# Patient Record
Sex: Male | Born: 1970 | Race: Black or African American | Hispanic: No | Marital: Single | Smoking: Never smoker
Health system: Southern US, Community
[De-identification: ages and names within clinical notes are randomized; demographics above are authoritative.]

## PROBLEM LIST (undated history)

## (undated) HISTORY — PX: HIP SURGERY: SHX245

## (undated) HISTORY — PX: ABDOMINAL SURGERY: SHX537

---

## 2007-08-02 ENCOUNTER — Emergency Department (HOSPITAL_COMMUNITY): Admission: EM | Admit: 2007-08-02 | Discharge: 2007-08-02 | Payer: Self-pay | Admitting: Emergency Medicine

## 2008-11-25 ENCOUNTER — Inpatient Hospital Stay (HOSPITAL_COMMUNITY): Admission: EM | Admit: 2008-11-25 | Discharge: 2008-11-26 | Payer: Self-pay | Admitting: Emergency Medicine

## 2008-11-25 ENCOUNTER — Ambulatory Visit: Payer: Self-pay | Admitting: *Deleted

## 2008-11-25 ENCOUNTER — Encounter (INDEPENDENT_AMBULATORY_CARE_PROVIDER_SITE_OTHER): Payer: Self-pay | Admitting: *Deleted

## 2008-11-26 ENCOUNTER — Encounter (INDEPENDENT_AMBULATORY_CARE_PROVIDER_SITE_OTHER): Payer: Self-pay | Admitting: *Deleted

## 2008-11-26 DIAGNOSIS — R0789 Other chest pain: Secondary | ICD-10-CM

## 2010-05-18 IMAGING — CT CT ANGIO CHEST
2 of 6 series · 19 of 46 positions shown · IV contrast (agent unspecified)
Comparison: Chest x-ray dated 11/25/2008

CLINICAL DATA: Substernal chest pain for 3 days.  Bilateral arm
numbness.

CT ANGIOGRAPHY CHEST WITH CONTRAST
TECHNIQUE: Multidetector CT imaging of the chest was performed
using the standard protocol during bolus administration of
intravenous contrast. Multiplanar CT image reconstructions
including MIPs were obtained to evaluate the vascular anatomy.
Contrast: 100 ml Tmnipaque-KAA

[Series 6: dissection 2.0 st · axial · 0.70mm/px · z∈[-299,-21]mm · 13 of 163 slices shown]
[im 12/163  lung]
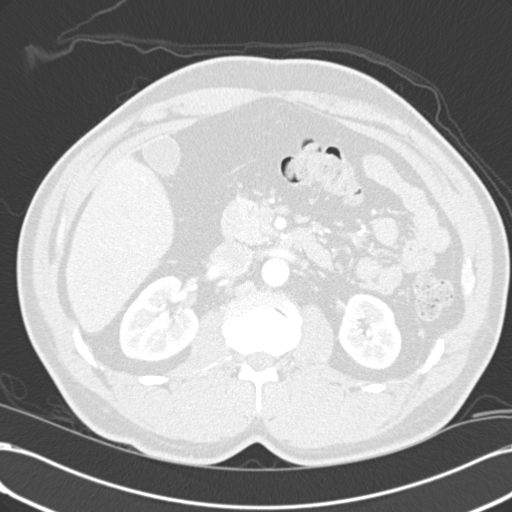
[im 24/163  soft-tissue]
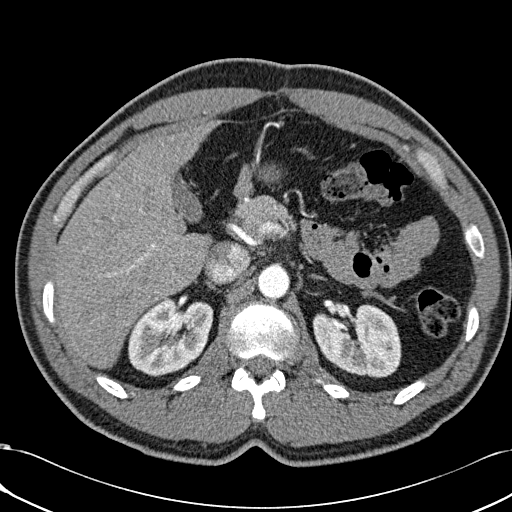
[im 35/163  lung]
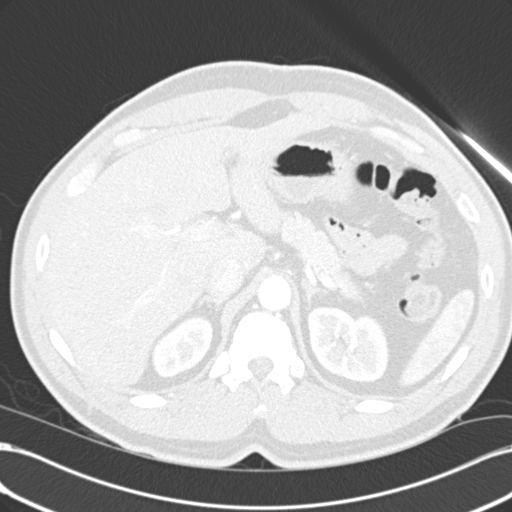
[im 47/163  soft-tissue]
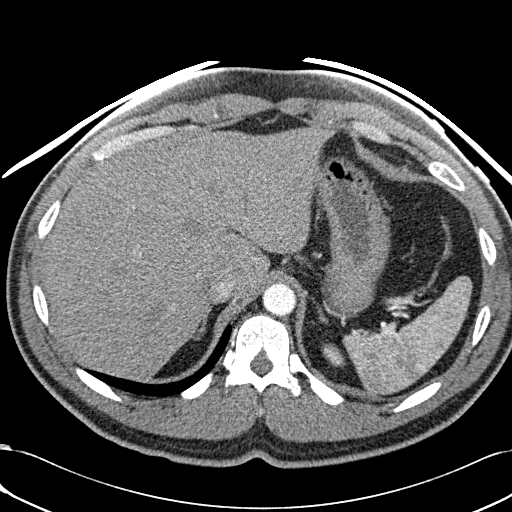
[im 58/163  lung]
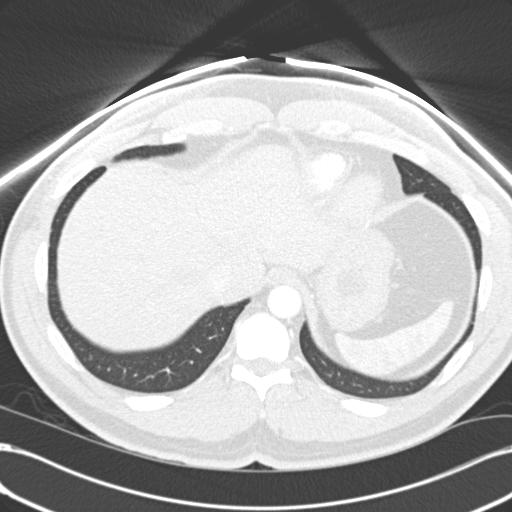
[im 70/163  soft-tissue]
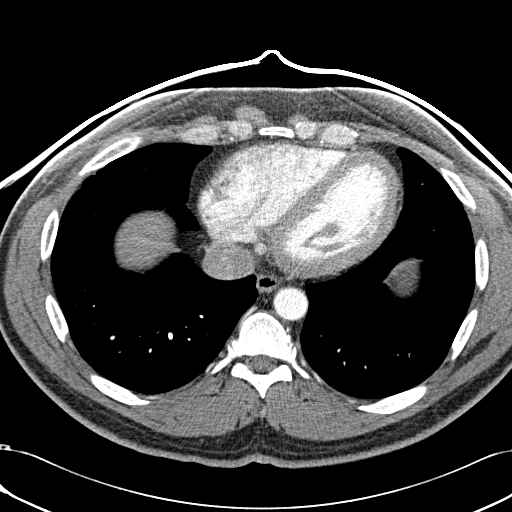
[im 82/163  lung]
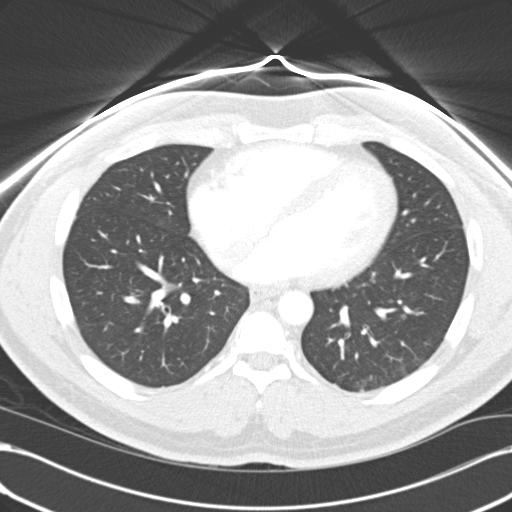
[im 93/163  soft-tissue]
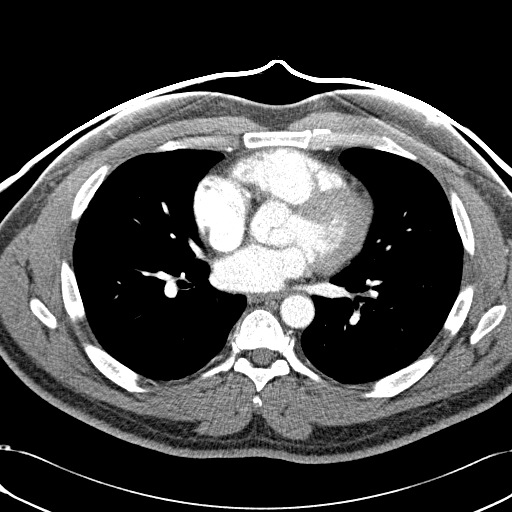
[im 105/163  lung]
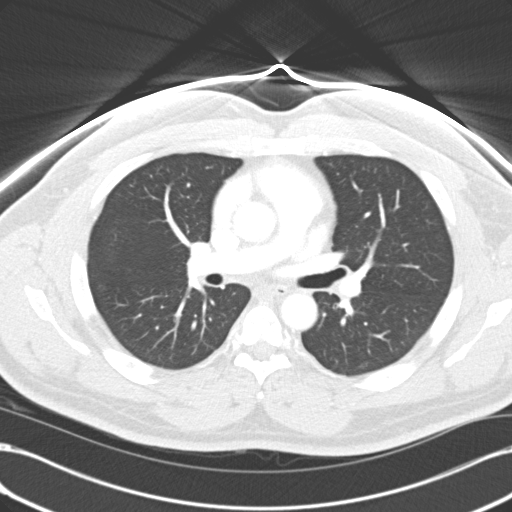
[im 116/163  soft-tissue]
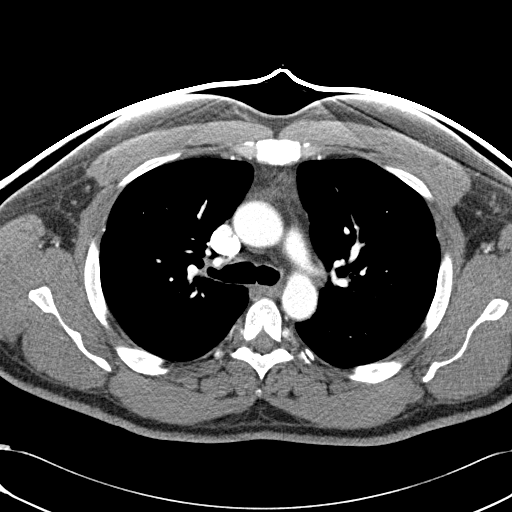
[im 128/163  lung]
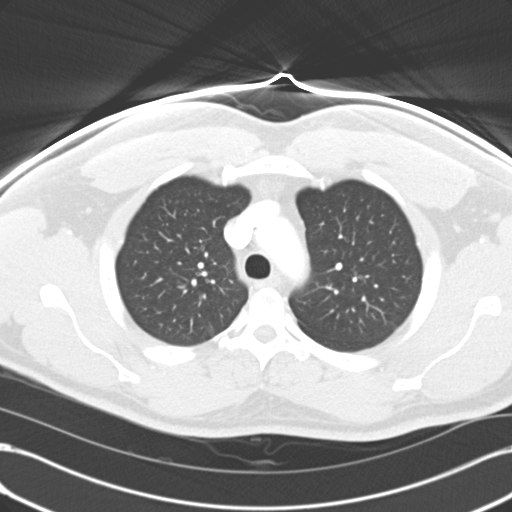
[im 139/163  soft-tissue]
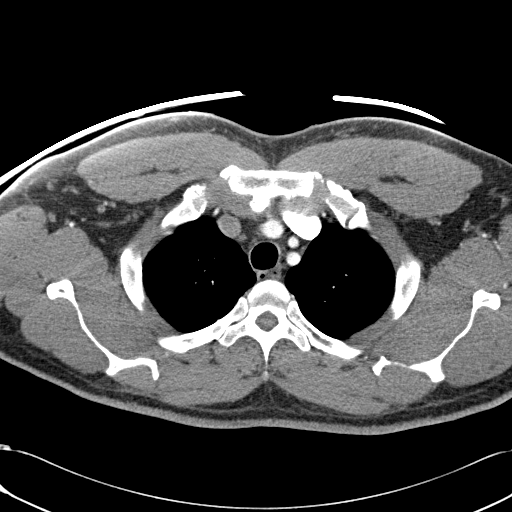
[im 151/163  lung]
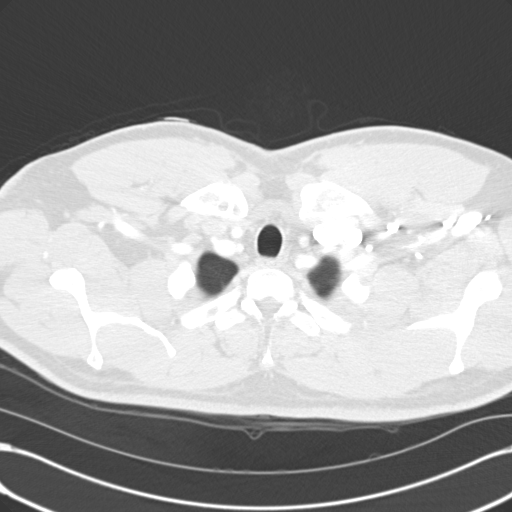

[Series 7: dissection 2.0 lung · axial · 0.70mm/px · z∈[-249,-99]mm · 6 of 139 slices shown]
[im 13/139  soft-tissue]
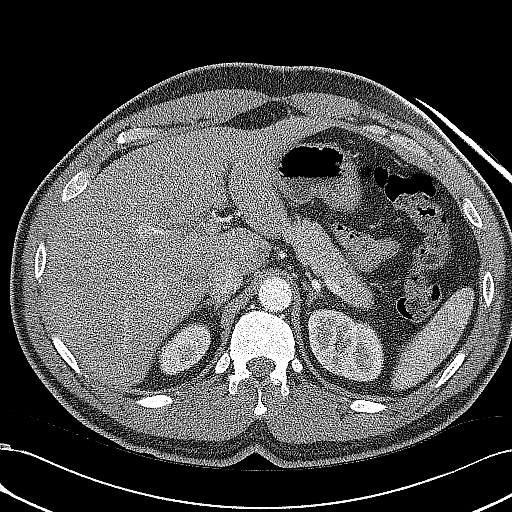
[im 26/139  soft-tissue]
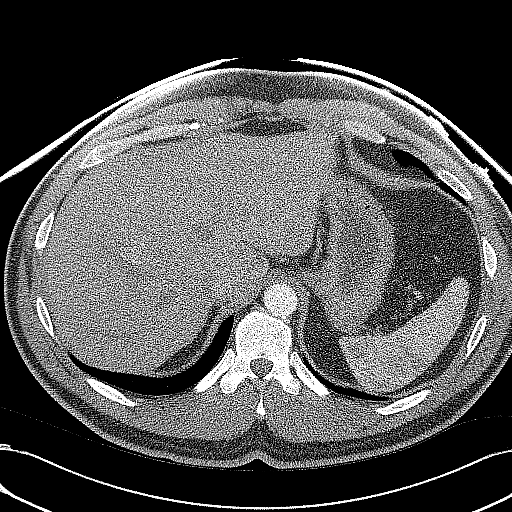
[im 51/139  soft-tissue]
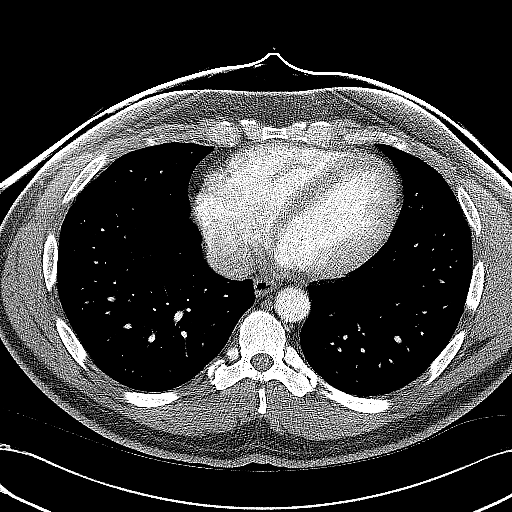
[im 63/139  soft-tissue]
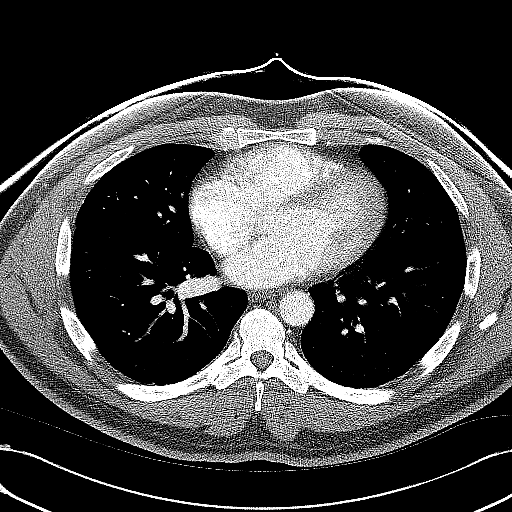
[im 76/139  soft-tissue]
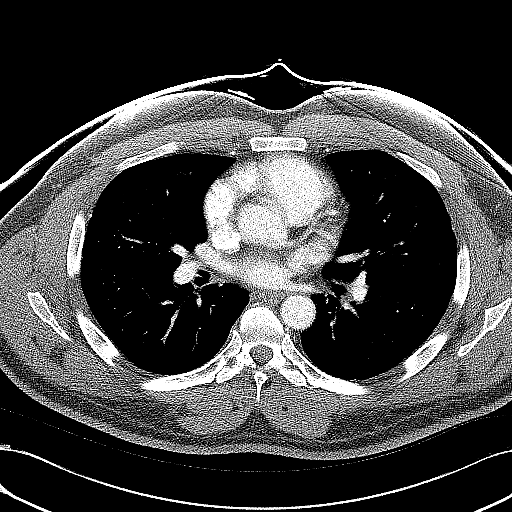
[im 88/139  soft-tissue]
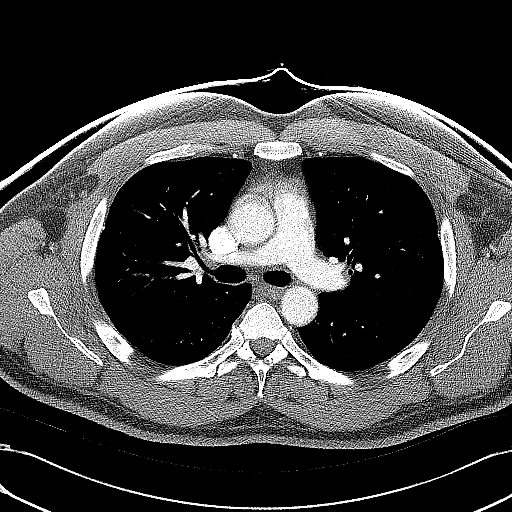

[19 of 46 positions shown; findings below may reference images not displayed]

FINDINGS: The thoracic aorta appears normal.  The origins of the
brachial cephalic vessels are widely patent.  Superior mesenteric
artery and celiac artery origins and renal artery origins are
widely patent. There is no evidence of aortic dissection or aortic
aneurysm.

There is no evidence of pulmonary emboli.  Heart size and pulmonary
venous vasculature is normal.  The lungs are clear.  No significant
bony abnormality.  The visualized portion of the upper abdomen is
normal.

Review of the MIP images confirms the above findings.
IMPRESSION: Normal CT angiogram of the chest.  Specifically, no evidence of
aortic dissection, aortic aneurysm, or pulmonary emboli.

## 2010-09-25 LAB — POCT CARDIAC MARKERS
CKMB, poc: 1.4 ng/mL (ref 1.0–8.0)
CKMB, poc: <1 ng/mL — ABNORMAL LOW (ref 1.0–8.0)
CKMB, poc: <1 ng/mL — ABNORMAL LOW (ref 1.0–8.0)
Myoglobin, poc: 220 ng/mL (ref 12–200)
Troponin i, poc: <0.05 ng/mL (ref 0.00–0.09)
Troponin i, poc: <0.05 ng/mL (ref 0.00–0.09)
Troponin i, poc: <0.05 ng/mL (ref 0.00–0.09)

## 2010-09-25 LAB — CBC
HCT: 38.8 % — ABNORMAL LOW (ref 39.0–52.0)
HCT: 41.3 % (ref 39.0–52.0)
Hemoglobin: 14 g/dL (ref 13.0–17.0)
MCV: 88.4 fL (ref 78.0–100.0)
Platelets: 207 10*3/uL (ref 150–400)
Platelets: 218 10*3/uL (ref 150–400)
RBC: 4.67 MIL/uL (ref 4.22–5.81)
RDW: 12.6 % (ref 11.5–15.5)
WBC: 4.5 10*3/uL (ref 4.0–10.5)
WBC: 5.2 10*3/uL (ref 4.0–10.5)

## 2010-09-25 LAB — LIPID PANEL
Triglycerides: 74 mg/dL (ref <150)
VLDL: 15 mg/dL (ref 0–40)

## 2010-09-25 LAB — DIFFERENTIAL
Basophils Absolute: 0.1 10*3/uL (ref 0.0–0.1)
Basophils Relative: 2 % — ABNORMAL HIGH (ref 0–1)
Eosinophils Absolute: 0.2 10*3/uL (ref 0.0–0.7)
Eosinophils Relative: 4 % (ref 0–5)
Eosinophils Relative: 4 % (ref 0–5)
Lymphs Abs: 2.5 10*3/uL (ref 0.7–4.0)
Monocytes Absolute: 0.2 10*3/uL (ref 0.1–1.0)
Monocytes Absolute: 0.4 10*3/uL (ref 0.1–1.0)
Monocytes Relative: 5 % (ref 3–12)
Monocytes Relative: 9 % (ref 3–12)
Neutro Abs: 1.4 10*3/uL — ABNORMAL LOW (ref 1.7–7.7)

## 2010-09-25 LAB — RAPID URINE DRUG SCREEN, HOSP PERFORMED
Barbiturates: NOT DETECTED
Opiates: NOT DETECTED
Tetrahydrocannabinol: NOT DETECTED

## 2010-09-25 LAB — URINALYSIS, ROUTINE W REFLEX MICROSCOPIC
Bilirubin Urine: NEGATIVE
Nitrite: NEGATIVE
Specific Gravity, Urine: 1.029 (ref 1.005–1.030)
Urobilinogen, UA: 1 mg/dL (ref 0.0–1.0)

## 2010-09-25 LAB — HEPATIC FUNCTION PANEL
AST: 21 U/L (ref 0–37)
Albumin: 3.7 g/dL (ref 3.5–5.2)
Alkaline Phosphatase: 53 U/L (ref 39–117)
Total Bilirubin: 0.3 mg/dL (ref 0.3–1.2)
Total Protein: 7.2 g/dL (ref 6.0–8.3)

## 2010-09-25 LAB — LACTIC ACID, PLASMA: Lactic Acid, Venous: 1.1 mmol/L (ref 0.5–2.2)

## 2010-09-25 LAB — BASIC METABOLIC PANEL
BUN: 11 mg/dL (ref 6–23)
BUN: 13 mg/dL (ref 6–23)
Chloride: 102 mEq/L (ref 96–112)
Chloride: 105 mEq/L (ref 96–112)
GFR calc non Af Amer: >60 mL/min (ref >60)
Glucose, Bld: 92 mg/dL (ref 70–99)
Potassium: 3.6 mEq/L (ref 3.5–5.1)
Potassium: 3.7 mEq/L (ref 3.5–5.1)
Sodium: 138 mEq/L (ref 135–145)
Sodium: 138 mEq/L (ref 135–145)

## 2010-09-25 LAB — CARDIAC PANEL(CRET KIN+CKTOT+MB+TROPI)
CK, MB: 1.5 ng/mL (ref 0.3–4.0)
CK, MB: 2 ng/mL (ref 0.3–4.0)
Relative Index: 1 (ref 0.0–2.5)
Relative Index: 1.3 (ref 0.0–2.5)
Troponin I: 0.02 ng/mL (ref 0.00–0.06)

## 2010-09-25 LAB — TSH: TSH: 1.309 u[IU]/mL (ref 0.350–4.500)

## 2010-10-11 ENCOUNTER — Emergency Department (HOSPITAL_COMMUNITY): Payer: No Typology Code available for payment source

## 2010-10-11 ENCOUNTER — Emergency Department (HOSPITAL_COMMUNITY)
Admission: EM | Admit: 2010-10-11 | Discharge: 2010-10-12 | Disposition: A | Discharge status: Home / Self Care | Payer: No Typology Code available for payment source | Attending: Emergency Medicine | Admitting: Emergency Medicine

## 2010-10-11 DIAGNOSIS — S139XXA Sprain of joints and ligaments of unspecified parts of neck, initial encounter: Secondary | ICD-10-CM | POA: Insufficient documentation

## 2010-10-11 DIAGNOSIS — Y92410 Unspecified street and highway as the place of occurrence of the external cause: Secondary | ICD-10-CM | POA: Insufficient documentation

## 2010-10-11 DIAGNOSIS — S20219A Contusion of unspecified front wall of thorax, initial encounter: Secondary | ICD-10-CM | POA: Insufficient documentation

## 2010-10-11 DIAGNOSIS — Z8614 Personal history of Methicillin resistant Staphylococcus aureus infection: Secondary | ICD-10-CM | POA: Insufficient documentation
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
## 2010-12-14 ENCOUNTER — Other Ambulatory Visit (HOSPITAL_COMMUNITY): Payer: Self-pay | Admitting: Chiropractic Medicine

## 2010-12-14 DIAGNOSIS — R52 Pain, unspecified: Secondary | ICD-10-CM

## 2010-12-21 ENCOUNTER — Ambulatory Visit (HOSPITAL_COMMUNITY)
Admission: RE | Admit: 2010-12-21 | Discharge: 2010-12-21 | Disposition: A | Discharge status: Home / Self Care | Payer: No Typology Code available for payment source | Source: Ambulatory Visit | Attending: Chiropractic Medicine | Admitting: Chiropractic Medicine

## 2010-12-21 DIAGNOSIS — R9431 Abnormal electrocardiogram [ECG] [EKG]: Secondary | ICD-10-CM | POA: Insufficient documentation

## 2010-12-21 DIAGNOSIS — M5030 Other cervical disc degeneration, unspecified cervical region: Secondary | ICD-10-CM | POA: Insufficient documentation

## 2010-12-21 DIAGNOSIS — R52 Pain, unspecified: Secondary | ICD-10-CM

## 2010-12-21 DIAGNOSIS — M25519 Pain in unspecified shoulder: Secondary | ICD-10-CM | POA: Insufficient documentation

## 2010-12-21 DIAGNOSIS — R29898 Other symptoms and signs involving the musculoskeletal system: Secondary | ICD-10-CM | POA: Insufficient documentation

## 2010-12-21 DIAGNOSIS — R209 Unspecified disturbances of skin sensation: Secondary | ICD-10-CM | POA: Insufficient documentation

## 2010-12-21 DIAGNOSIS — M79609 Pain in unspecified limb: Secondary | ICD-10-CM | POA: Insufficient documentation

## 2011-07-21 ENCOUNTER — Encounter (HOSPITAL_COMMUNITY): Payer: Self-pay | Admitting: Emergency Medicine

## 2011-07-21 ENCOUNTER — Emergency Department (HOSPITAL_COMMUNITY)
Admission: EM | Admit: 2011-07-21 | Discharge: 2011-07-21 | Disposition: A | Discharge status: Home / Self Care | Payer: Self-pay | Attending: Emergency Medicine | Admitting: Emergency Medicine

## 2011-07-21 DIAGNOSIS — L02419 Cutaneous abscess of limb, unspecified: Secondary | ICD-10-CM | POA: Insufficient documentation

## 2011-07-21 DIAGNOSIS — L0291 Cutaneous abscess, unspecified: Secondary | ICD-10-CM

## 2011-07-21 DIAGNOSIS — M79609 Pain in unspecified limb: Secondary | ICD-10-CM | POA: Insufficient documentation

## 2011-07-21 MED ORDER — DOXYCYCLINE HYCLATE 100 MG PO CAPS: 100.0000 mg | ORAL_CAPSULE | Freq: Two times a day (BID) | ORAL | Status: AC

## 2011-07-21 MED ORDER — OXYCODONE-ACETAMINOPHEN 5-325 MG PO TABS: 1.0000 | ORAL_TABLET | ORAL | Status: AC | PRN

## 2011-07-21 MED ORDER — OXYCODONE-ACETAMINOPHEN 5-325 MG PO TABS
1.0000 | ORAL_TABLET | Freq: Once | ORAL | Status: DC
  Filled 2011-07-21: qty 1

## 2011-07-21 MED ORDER — LIDOCAINE-EPINEPHRINE 2 %-1:100000 IJ SOLN
1.7000 mL | Freq: Once | INTRAMUSCULAR | Status: AC
  Administered 2011-07-21: 1.7 mL via INTRADERMAL

## 2011-07-21 NOTE — ED Provider Notes (Signed)
History     CSN: [id number]  Arrival date & time 07/21/11  1301   First MD Initiated Contact with Patient 07/21/11 1319      Chief Complaint  Patient presents with  . Leg Pain    Pt has redness to right knee area.     Patient is a 41 y.o. male presenting with leg pain. The history is provided by the patient.  Leg Pain  The incident occurred 2 days ago. The incident occurred at home. There was no injury mechanism. The pain is present in the right thigh. The quality of the pain is described as aching. The pain is mild. The pain has been constant since onset. Pertinent negatives include no numbness, no loss of motion and no tingling. The symptoms are aggravated by activity.  pt with redness/swelling to right thigh No injury but he reports that he has had "cyst" on his arm before He has no fever/vomiting  History reviewed. No pertinent past medical history.  Past Surgical History  Procedure Date  . Abdominal surgery     History reviewed. No pertinent family history.  History  Substance Use Topics  . Smoking status: Never Smoker   . Smokeless tobacco: Not on file  . Alcohol Use: No      Review of Systems  Constitutional: Negative for fever.  Neurological: Negative for tingling and numbness.    Allergies  Review of patient's allergies indicates no known allergies.  Home Medications   Current Outpatient Rx  Name Route Sig Dispense Refill  . DOXYCYCLINE HYCLATE 100 MG PO CAPS Oral Take 1 capsule (100 mg total) by mouth 2 (two) times daily. 10 capsule 0  . OXYCODONE-ACETAMINOPHEN 5-325 MG PO TABS Oral Take 1 tablet by mouth every 4 (four) hours as needed for pain. 15 tablet 0    BP 127/83  Pulse 66  Temp(Src) 98.3 F (36.8 C) (Oral)  Resp 20  SpO2 97%  Physical Exam CONSTITUTIONAL: Well developed/well nourished HEAD AND FACE: Normocephalic/atraumatic EYES: EOMI/PERRL ENMT: Mucous membranes moist NECK: supple no meningeal signs CV: S1/S2 noted, no  murmurs/rubs/gallops noted LUNGS: Lungs are clear to auscultation bilaterally, no apparent distress ABDOMEN: soft, nontender, no rebound or guarding NEURO: Pt is awake/alert, moves all extremitiesx4 EXTREMITIES: pulses normal, full ROM Abscess noted right distal thigh but it is proximal to knee and does not encroach into joint Small amt of fluctuance with surrounding erythema/induration No crepitance Full ROM of right knee SKIN: warm, color normal PSYCH: no abnormalities of mood noted  ED Course  Procedures   INCISION AND DRAINAGE Performed by: Joya Gaskins Consent: Verbal consent obtained. Risks and benefits: risks, benefits and alternatives were discussed Time out performed Type: abscess  Body area: right thigh  Anesthesia: local infiltration  Local anesthetic: lidocaine 2% with epinephrine  Anesthetic total: 41m ml  Complexity: complex Blunt dissection to break up loculations  Drainage: purulent  Drainage amount: minimal  Packing material: 1/4 in iodoform gauze  Patient tolerance: Patient tolerated the procedure well with no immediate complications.    Advised that if packing falls out but it is improving without redness/fluctuance he does not need to return.  If it does not fall out he is to return in 48 hours  1. Abscess       MDM  Nursing notes reviewed and considered in documentation         Joya Gaskins, MD 07/21/11 [phone]

## 2011-07-21 NOTE — ED Notes (Signed)
Pt reports redness to right knee area. Pt denies being bit by bug. Pt reports had same thing on arm in the past. Area hot to touch and hard.

## 2012-06-09 ENCOUNTER — Emergency Department (HOSPITAL_COMMUNITY)
Admission: EM | Admit: 2012-06-09 | Discharge: 2012-06-09 | Disposition: A | Discharge status: Home Health | Payer: Self-pay | Attending: Emergency Medicine | Admitting: Emergency Medicine

## 2012-06-09 ENCOUNTER — Encounter (HOSPITAL_COMMUNITY): Payer: Self-pay | Admitting: *Deleted

## 2012-06-09 ENCOUNTER — Emergency Department (HOSPITAL_COMMUNITY): Payer: Self-pay

## 2012-06-09 DIAGNOSIS — R110 Nausea: Secondary | ICD-10-CM | POA: Insufficient documentation

## 2012-06-09 DIAGNOSIS — M545 Low back pain, unspecified: Secondary | ICD-10-CM | POA: Insufficient documentation

## 2012-06-09 DIAGNOSIS — N509 Disorder of male genital organs, unspecified: Secondary | ICD-10-CM | POA: Insufficient documentation

## 2012-06-09 DIAGNOSIS — R3915 Urgency of urination: Secondary | ICD-10-CM | POA: Insufficient documentation

## 2012-06-09 DIAGNOSIS — Z9889 Other specified postprocedural states: Secondary | ICD-10-CM | POA: Insufficient documentation

## 2012-06-09 DIAGNOSIS — N23 Unspecified renal colic: Secondary | ICD-10-CM | POA: Insufficient documentation

## 2012-06-09 DIAGNOSIS — R39198 Other difficulties with micturition: Secondary | ICD-10-CM | POA: Insufficient documentation

## 2012-06-09 LAB — BASIC METABOLIC PANEL
Chloride: 98 mEq/L (ref 96–112)
GFR calc Af Amer: 80 mL/min — ABNORMAL LOW (ref >90)
GFR calc non Af Amer: 69 mL/min — ABNORMAL LOW (ref >90)
Potassium: 4.6 mEq/L (ref 3.5–5.1)
Sodium: 135 mEq/L (ref 135–145)

## 2012-06-09 LAB — CBC
HCT: 39.7 % (ref 39.0–52.0)
Hemoglobin: 12.9 g/dL — ABNORMAL LOW (ref 13.0–17.0)
MCHC: 32.5 g/dL (ref 30.0–36.0)
RDW: 12.5 % (ref 11.5–15.5)
WBC: 6.3 10*3/uL (ref 4.0–10.5)

## 2012-06-09 LAB — URINALYSIS, ROUTINE W REFLEX MICROSCOPIC
Glucose, UA: NEGATIVE mg/dL
Ketones, ur: NEGATIVE mg/dL
Leukocytes, UA: NEGATIVE
Nitrite: NEGATIVE
Protein, ur: NEGATIVE mg/dL
pH: 6.5 (ref 5.0–8.0)

## 2012-06-09 MED ORDER — TAMSULOSIN HCL 0.4 MG PO CAPS: 0.4000 mg | ORAL_CAPSULE | ORAL | Status: DC

## 2012-06-09 MED ORDER — TAMSULOSIN HCL 0.4 MG PO CAPS
0.4000 mg | ORAL_CAPSULE | Freq: Once | ORAL | Status: AC
  Administered 2012-06-09: 0.4 mg via ORAL
  Filled 2012-06-09: qty 1

## 2012-06-09 MED ORDER — MORPHINE SULFATE 4 MG/ML IJ SOLN
6.0000 mg | Freq: Once | INTRAMUSCULAR | Status: AC
  Administered 2012-06-09: 6 mg via INTRAVENOUS
  Filled 2012-06-09 (×2): qty 1

## 2012-06-09 MED ORDER — IBUPROFEN 800 MG PO TABS: 800.0000 mg | ORAL_TABLET | Freq: Three times a day (TID) | ORAL | Status: DC

## 2012-06-09 MED ORDER — SODIUM CHLORIDE 0.9 % IV BOLUS (SEPSIS)
1500.0000 mL | Freq: Once | INTRAVENOUS | Status: AC
  Administered 2012-06-09: 1500 mL via INTRAVENOUS

## 2012-06-09 MED ORDER — ONDANSETRON HCL 4 MG/2ML IJ SOLN
4.0000 mg | INTRAMUSCULAR | Status: AC
  Administered 2012-06-09: 4 mg via INTRAVENOUS
  Filled 2012-06-09: qty 2

## 2012-06-09 MED ORDER — ONDANSETRON 4 MG PO TBDP: 4.0000 mg | ORAL_TABLET | Freq: Three times a day (TID) | ORAL | Status: DC | PRN

## 2012-06-09 MED ORDER — KETOROLAC TROMETHAMINE 30 MG/ML IJ SOLN
30.0000 mg | Freq: Once | INTRAMUSCULAR | Status: AC
  Administered 2012-06-09: 30 mg via INTRAVENOUS
  Filled 2012-06-09: qty 1

## 2012-06-09 MED ORDER — OXYCODONE-ACETAMINOPHEN 5-325 MG PO TABS: 1.0000 | ORAL_TABLET | Freq: Four times a day (QID) | ORAL | Status: DC | PRN

## 2012-06-09 NOTE — ED Notes (Signed)
Pt states he had a sudden onset of left flank pain that started about two hours ago; pt rates pain 10/10, "Sharp constant pain; won't go away"; pt denying any dysuria

## 2012-06-09 NOTE — ED Notes (Signed)
Pt given urine cup for UA.

## 2012-06-09 NOTE — ED Provider Notes (Signed)
History     CSN: [id number]  Arrival date & time 06/09/12  0208   First MD Initiated Contact with Patient 06/09/12 0229      Chief Complaint  Patient presents with  . Flank Pain    (Consider location/radiation/quality/duration/timing/severity/associated sxs/prior treatment) HPI Comments: Mr. Adrian Meza awoke < 2hrs ago wuith acute onset left flank pain radiating into the [place] abdomen and pelvis.  He feels an urge to urinate but has not produced a significant volume of urine.  He also reports nausea without vomiting.  He denies fever, diarrhea, and respiratory complaints.  Patient is a 41 y.o. male presenting with flank pain. The history is provided by the patient. No language interpreter was used.  Flank Pain This is a new problem. The current episode started 1 to 2 hours ago. The problem occurs constantly. The problem has not changed since onset.Associated symptoms include abdominal pain. Pertinent negatives include no chest pain and no headaches. Nothing aggravates the symptoms. He has tried nothing for the symptoms.    History reviewed. No pertinent past medical history.  Past Surgical History  Procedure Date  . Abdominal surgery     History reviewed. No pertinent family history.  History  Substance Use Topics  . Smoking status: Never Smoker   . Smokeless tobacco: Not on file  . Alcohol Use: No      Review of Systems  Constitutional: Negative.   HENT: Negative.   Eyes: Negative.   Respiratory: Negative.   Cardiovascular: Negative.  Negative for chest pain.  Gastrointestinal: Positive for nausea and abdominal pain. Negative for vomiting, diarrhea and constipation.  Genitourinary: Positive for urgency, flank pain, difficulty urinating and testicular pain (left-sided). Negative for dysuria, frequency, hematuria, scrotal swelling and penile pain.  Musculoskeletal: Negative.   Neurological: Negative.  Negative for headaches.  Hematological: Negative.     Allergies   Review of patient's allergies indicates no known allergies.  Home Medications  No current outpatient prescriptions on file.  BP 153/104  Pulse 73  Temp 97.6 F (36.4 C) (Oral)  Resp 20  SpO2 99%  Physical Exam  Nursing note and vitals reviewed. Constitutional: He is oriented to person, place, and time. He appears well-developed and well-nourished. He is cooperative.  Non-toxic appearance. He does not have a sickly appearance. He appears ill (acutely uncomfortable). No distress.  HENT:  Head: Normocephalic and atraumatic.  Right Ear: External ear normal.  Left Ear: External ear normal.  Nose: Nose normal.  Mouth/Throat: Oropharynx is clear and moist. No oropharyngeal exudate.  Eyes: Conjunctivae normal are normal. Pupils are equal, round, and reactive to light. Right eye exhibits no discharge. Left eye exhibits no discharge. No scleral icterus.  Neck: Normal range of motion. Neck supple. No JVD present. No tracheal deviation present.  Cardiovascular: Normal rate, regular rhythm, normal heart sounds and intact distal pulses.  Exam reveals no gallop and no friction rub.   No murmur heard. Pulmonary/Chest: Effort normal and breath sounds normal. No stridor. No respiratory distress. He has no wheezes. He has no rales. He exhibits no tenderness.  Abdominal: Soft. Bowel sounds are normal. He exhibits no distension and no mass. There is tenderness (left flank and back). There is no rebound and no guarding.  Musculoskeletal: Normal range of motion. He exhibits tenderness (left lower back). He exhibits no edema.  Neurological: He is alert and oriented to person, place, and time.  Skin: Skin is warm and dry. No rash noted. He is not diaphoretic. No erythema. No pallor.  Psychiatric: He has a normal mood and affect. His behavior is normal.    ED Course  Procedures (including critical care time)   Labs Reviewed  URINALYSIS, ROUTINE W REFLEX MICROSCOPIC  CBC  BASIC METABOLIC PANEL   No  results found.   No diagnosis found.    MDM  Pt presents for evaluation of left flank and back pain with increase urinary urgency.  He is rocking and writhing and appears acutely uncomfortable.  Note elevated BP, otherwise stable VS, NAD.  Pt has a hx and exam concerning for ureteral colic.  Will provide symptomatic care while awaiting results of basic labs and imaging.  He has never previously had similar symptoms.  1027.  Pt stable, NAD.  Pain is improved.  Note unremarkable basic labs.  The urinalysis is still pending.  CT scan demonstrates a 2 mm left distal ureteral stone.  Will await the results of the U/A.  Anticipate discharge home. 2536.  Pt stable, NAD.  Note nl urinalysis.  Will prescribe flomax, ibuprofen, zofran, and percocet.  Encouraged outpt follow-up if pain persists.      Tobin Chad, MD 06/09/12 [phone]

## 2012-06-09 NOTE — ED Notes (Signed)
Patient transported to CT 

## 2012-06-09 NOTE — ED Notes (Signed)
Multiple family members at bedside. Pt awakens easily. Denies pain at present

## 2012-06-09 NOTE — ED Notes (Signed)
Family at bedside. Pt sleeping, warm blankets provided.
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
## 2013-03-22 ENCOUNTER — Emergency Department (HOSPITAL_COMMUNITY): Payer: Non-veteran care

## 2013-03-22 ENCOUNTER — Emergency Department (HOSPITAL_COMMUNITY)
Admission: EM | Admit: 2013-03-22 | Discharge: 2013-03-23 | Disposition: A | Discharge status: Home / Self Care | Payer: Non-veteran care | Attending: Emergency Medicine | Admitting: Emergency Medicine

## 2013-03-22 ENCOUNTER — Encounter (HOSPITAL_COMMUNITY): Payer: Self-pay | Admitting: Emergency Medicine

## 2013-03-22 DIAGNOSIS — L089 Local infection of the skin and subcutaneous tissue, unspecified: Secondary | ICD-10-CM

## 2013-03-22 DIAGNOSIS — M7989 Other specified soft tissue disorders: Secondary | ICD-10-CM | POA: Insufficient documentation

## 2013-03-22 MED ORDER — KETOROLAC TROMETHAMINE 30 MG/ML IJ SOLN: 30.0000 mg | Freq: Once | INTRAMUSCULAR | Status: DC

## 2013-03-22 MED ORDER — IOHEXOL 300 MG/ML  SOLN
80.0000 mL | Freq: Once | INTRAMUSCULAR | Status: AC | PRN
  Administered 2013-03-22: 80 mL via INTRAVENOUS

## 2013-03-22 MED ORDER — FLUORESCEIN SODIUM 1 MG OP STRP
1.0000 | ORAL_STRIP | Freq: Once | OPHTHALMIC | Status: AC
  Administered 2013-03-22: 1 via OPHTHALMIC
  Filled 2013-03-22: qty 1

## 2013-03-22 MED ORDER — ERYTHROMYCIN 5 MG/GM OP OINT
TOPICAL_OINTMENT | OPHTHALMIC | Status: AC
Start: 2013-03-22 — End: ?

## 2013-03-22 MED ORDER — TETRACAINE HCL 0.5 % OP SOLN
2.0000 [drp] | Freq: Once | OPHTHALMIC | Status: AC
  Administered 2013-03-22: 2 [drp] via OPHTHALMIC
  Filled 2013-03-22: qty 2

## 2013-03-22 MED ORDER — CEPHALEXIN 500 MG PO CAPS: 500.0000 mg | ORAL_CAPSULE | Freq: Two times a day (BID) | ORAL | Status: AC

## 2013-03-22 MED ORDER — ERYTHROMYCIN 5 MG/GM OP OINT
TOPICAL_OINTMENT | Freq: Once | OPHTHALMIC | Status: AC
  Administered 2013-03-22: via OPHTHALMIC
  Filled 2013-03-22: qty 1

## 2013-03-22 NOTE — ED Provider Notes (Signed)
Medical screening examination/treatment/procedure(s) were conducted as a shared visit with non-physician practitioner(s) and myself.  I personally evaluated the patient during the encounter  Patient here with concern for L eye redness. Multiple lesions on forehead of L, one in L hairline, L pre-auricular node. L eye without hyphema or hypopion, mild redness of conjunctiva, no proptosis. Patient states pain with EOMs, however is looking around the room with ease. CT of orbits without post-septal cellulitis, possible pre-septal cellulitic changes, placed on keflex. On fluorescein, no large uptake, no ulcer. Placed on erythromycin ointment for emollient properties. Instructed to f/u with PCP and Ophtho.  Dagmar Hait, MD 03/22/13 [phone]

## 2013-03-22 NOTE — ED Notes (Signed)
Pt presented to ED with left eye pain and reddness and also complains of watery of eye.Pt also has of two pump on his forehead.Pt complains that it hurts.

## 2013-03-22 NOTE — ED Notes (Signed)
Visual acuity done rt and left eye 20/20

## 2013-03-22 NOTE — ED Provider Notes (Signed)
CSN: [id number]     Arrival date & time 03/22/13  2001 History  This chart was scribed for non-physician practitioner, Irish Elders, NP working with Dagmar Hait, MD by Greggory Stallion, ED scribe. This patient was seen in room TR04C/TR04C and the patient's care was started at 9:41 PM.   Chief Complaint  Patient presents with  . Eye Pain   The history is provided by the patient. No language interpreter was used.    HPI Comments: Adrian Meza is a 42 y.o. male who presents to the Emergency Department complaining of gradual onset, constant left eye pain with associated redness and watery discharge that started 2 days ago. He denies any injury. Pt states he also has an intermittent headache and two bumps on his forehead that are swollen and tender. He denies dental pain, visual disturbance, fever and chills. Pt denies any recent travel or illness.   No past medical history on file. Past Surgical History  Procedure Laterality Date  . Abdominal surgery     No family history on file. History  Substance Use Topics  . Smoking status: Never Smoker   . Smokeless tobacco: Not on file  . Alcohol Use: No    Review of Systems  Constitutional: Negative for fever and chills.  HENT: Negative for dental problem.        Positive facial pain.   Eyes: Positive for pain and redness. Negative for visual disturbance.  All other systems reviewed and are negative.    Allergies  Review of patient's allergies indicates no known allergies.  Home Medications   Current Outpatient Rx  Name  Route  Sig  Dispense  Refill  . Tamsulosin HCl (FLOMAX) 0.4 MG CAPS   Oral   Take 1 capsule (0.4 mg total) by mouth 1 day or 1 dose.   7 capsule   0    BP 124/87  Pulse 76  Temp(Src) 98.2 F (36.8 C) (Oral)  Resp 18  Ht 5\' 9"  (1.753 m)  Wt 215 lb (97.523 kg)  BMI 31.74 kg/m2  SpO2 98%  Physical Exam  Nursing note and vitals reviewed. Constitutional: He is oriented to person, place, and time. He  appears well-developed and well-nourished. No distress.  HENT:  Head: Normocephalic and atraumatic.    Right Ear: Tympanic membrane and external ear normal.  Left Ear: Tympanic membrane and external ear normal.  Mouth/Throat: Uvula is midline.  Red, raised lesions.   Eyes: Pupils are equal, round, and reactive to light. Right eye exhibits no discharge. No foreign body present in the right eye. Left eye exhibits chemosis. Left eye exhibits no discharge. No foreign body present in the left eye. Left conjunctiva is injected. Left conjunctiva has no hemorrhage. Right eye exhibits normal extraocular motion.  Slit lamp exam:      The left eye shows fluorescein uptake. The left eye shows no corneal abrasion, no corneal ulcer, no foreign body, no hyphema and no anterior chamber bulge.  Left eye painful EOMs. Periorbital tenderness.   Neck: Neck supple. No tracheal deviation present.  Preauricular lymph node on the left side.   Cardiovascular: Normal rate, regular rhythm and normal heart sounds.   Pulmonary/Chest: Effort normal and breath sounds normal. No respiratory distress. He has no wheezes. He has no rales.  Musculoskeletal: Normal range of motion.  Neurological: He is alert and oriented to person, place, and time.  Skin: Skin is warm and dry.  Psychiatric: He has a normal mood and affect. His behavior  is normal.    ED Course  Procedures (including critical care time)  DIAGNOSTIC STUDIES: Oxygen Saturation is 98% on RA, normal by my interpretation.    COORDINATION OF CARE: 9:47 PM-Discussed treatment plan which includes having Dr. Gwendolyn Grant taking a look at the pt with pt at bedside and pt agreed to plan.   9:54 PM-Dr. Gwendolyn Grant recommended doing a head CT to rule out periorbital cellulitis.   11:06 PM-Checked pt for corneal abrasion. There was no signs of one. Advised pt of his CT results and will discharge him with an antibiotic and an opthalmology follow up.   Labs Review Labs Reviewed  - No data to display Imaging Review Ct Orbits W/cm  03/22/2013   *RADIOLOGY REPORT*  Clinical Data: Rule out left orbital cellulitis.  CT ORBITS WITH CONTRAST  Technique:  Multidetector CT imaging of the orbits was performed following the bolus administration of intravenous contrast. Coronal and sagittal re-formations were viewed.  Contrast: 80mL OMNIPAQUE IOHEXOL 300 MG/ML  SOLN  Comparison: None available at time of study interpretation.  Findings: Mild thickening of the left periorbital soft tissues without subcutaneous gas, abnormal enhancement, effusion or focal fluid collections.  Ocular globes are well formed and located. Normal appearance of extraocular muscles bilaterally.  Preservation of orbital fat.  Superior ophthalmic veins are not enlarged. Normal appearance of bilateral optic nerve sheath complexes.  Remote minimally depressed bilateral nasal bone fractures.  No acute orbital fracture.  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  No destructive bony lesions.  Minimal paranasal sinus mucosal thickening without air fluid levels.  Mastoid air cells appear well aerated.  IMPRESSION: Mild left periorbital soft tissue swelling which could reflect cellulitis, no abscess or post septal involvement.  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   Original Report Authenticated By: Awilda Metro    MDM   1. Soft tissue infection      Mild left periorbital soft tissue swelling, no abscess or post-septal involvement. Painful EOM's. Visual acuity 20/20 in both eyes. No ulcerations or foreign bodies. Follow-up with opthalmology as soon as appt available. Erythromycin ointment, left eye. Keflex for skin involvement.      Irish Elders, NP 03/22/13 [phone]

## 2013-03-23 NOTE — ED Notes (Signed)
Pt discharged.Vital signs stable and GCS 15.pt refuses pain medication.

## 2016-04-19 ENCOUNTER — Encounter (HOSPITAL_COMMUNITY): Payer: Self-pay | Admitting: *Deleted

## 2016-04-19 ENCOUNTER — Emergency Department (HOSPITAL_COMMUNITY)
Admission: EM | Admit: 2016-04-19 | Discharge: 2016-04-19 | Disposition: A | Discharge status: Home / Self Care | Payer: Non-veteran care | Attending: Emergency Medicine | Admitting: Emergency Medicine

## 2016-04-19 DIAGNOSIS — Y939 Activity, unspecified: Secondary | ICD-10-CM | POA: Insufficient documentation

## 2016-04-19 DIAGNOSIS — S39012A Strain of muscle, fascia and tendon of lower back, initial encounter: Secondary | ICD-10-CM | POA: Insufficient documentation

## 2016-04-19 DIAGNOSIS — Y92410 Unspecified street and highway as the place of occurrence of the external cause: Secondary | ICD-10-CM | POA: Insufficient documentation

## 2016-04-19 DIAGNOSIS — S199XXA Unspecified injury of neck, initial encounter: Secondary | ICD-10-CM | POA: Diagnosis present

## 2016-04-19 DIAGNOSIS — S161XXA Strain of muscle, fascia and tendon at neck level, initial encounter: Secondary | ICD-10-CM | POA: Diagnosis not present

## 2016-04-19 DIAGNOSIS — Y999 Unspecified external cause status: Secondary | ICD-10-CM | POA: Insufficient documentation

## 2016-04-19 MED ORDER — IBUPROFEN 600 MG PO TABS: 600.0000 mg | ORAL_TABLET | Freq: Four times a day (QID) | ORAL | 0 refills | Status: DC | PRN

## 2016-04-19 MED ORDER — HYDROCODONE-ACETAMINOPHEN 5-325 MG PO TABS: 1.0000 | ORAL_TABLET | Freq: Four times a day (QID) | ORAL | 0 refills | Status: AC | PRN

## 2016-04-19 MED ORDER — CYCLOBENZAPRINE HCL 10 MG PO TABS: 10.0000 mg | ORAL_TABLET | Freq: Two times a day (BID) | ORAL | 0 refills | Status: DC | PRN

## 2016-04-19 NOTE — ED Notes (Signed)
[facility] COUPON 187 GIVEN.
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
## 2016-12-07 ENCOUNTER — Emergency Department (HOSPITAL_COMMUNITY)
Admission: EM | Admit: 2016-12-07 | Discharge: 2016-12-08 | Disposition: A | Discharge status: Home / Self Care | Payer: No Typology Code available for payment source | Attending: Emergency Medicine | Admitting: Emergency Medicine

## 2016-12-07 ENCOUNTER — Encounter (HOSPITAL_COMMUNITY): Payer: Self-pay | Admitting: *Deleted

## 2016-12-07 DIAGNOSIS — S161XXA Strain of muscle, fascia and tendon at neck level, initial encounter: Secondary | ICD-10-CM | POA: Diagnosis not present

## 2016-12-07 DIAGNOSIS — Y92410 Unspecified street and highway as the place of occurrence of the external cause: Secondary | ICD-10-CM | POA: Insufficient documentation

## 2016-12-07 DIAGNOSIS — S199XXA Unspecified injury of neck, initial encounter: Secondary | ICD-10-CM | POA: Diagnosis present

## 2016-12-07 DIAGNOSIS — S5011XA Contusion of right forearm, initial encounter: Secondary | ICD-10-CM | POA: Diagnosis not present

## 2016-12-07 DIAGNOSIS — S39012A Strain of muscle, fascia and tendon of lower back, initial encounter: Secondary | ICD-10-CM | POA: Diagnosis not present

## 2016-12-07 DIAGNOSIS — S50811A Abrasion of right forearm, initial encounter: Secondary | ICD-10-CM

## 2016-12-07 DIAGNOSIS — Y93I9 Activity, other involving external motion: Secondary | ICD-10-CM | POA: Diagnosis not present

## 2016-12-07 DIAGNOSIS — Y999 Unspecified external cause status: Secondary | ICD-10-CM | POA: Diagnosis not present

## 2016-12-07 DIAGNOSIS — M25551 Pain in right hip: Secondary | ICD-10-CM | POA: Diagnosis not present
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
## 2016-12-08 ENCOUNTER — Emergency Department (HOSPITAL_COMMUNITY): Payer: No Typology Code available for payment source

## 2016-12-08 MED ORDER — CYCLOBENZAPRINE HCL 10 MG PO TABS: 10.0000 mg | ORAL_TABLET | Freq: Two times a day (BID) | ORAL | 0 refills | Status: AC | PRN

## 2016-12-08 MED ORDER — IBUPROFEN 600 MG PO TABS: 600.0000 mg | ORAL_TABLET | Freq: Four times a day (QID) | ORAL | 0 refills | Status: AC | PRN
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
## 2020-03-28 ENCOUNTER — Emergency Department (HOSPITAL_COMMUNITY): Payer: No Typology Code available for payment source

## 2020-03-28 ENCOUNTER — Emergency Department (HOSPITAL_COMMUNITY)
Admission: EM | Admit: 2020-03-28 | Discharge: 2020-03-28 | Disposition: A | Discharge status: Home / Self Care | Payer: No Typology Code available for payment source | Attending: Emergency Medicine | Admitting: Emergency Medicine

## 2020-03-28 DIAGNOSIS — R079 Chest pain, unspecified: Secondary | ICD-10-CM | POA: Diagnosis not present

## 2020-03-28 DIAGNOSIS — R002 Palpitations: Secondary | ICD-10-CM | POA: Insufficient documentation

## 2020-03-28 LAB — BASIC METABOLIC PANEL
Anion gap: 7 (ref 5–15)
BUN: 12 mg/dL (ref 6–20)
CO2: 29 mmol/L (ref 22–32)
Calcium: 9.8 mg/dL (ref 8.9–10.3)
Chloride: 101 mmol/L (ref 98–111)
Creatinine, Ser: 1.03 mg/dL (ref 0.61–1.24)
GFR, Estimated: >60 mL/min (ref >60)
Glucose, Bld: 103 mg/dL — ABNORMAL HIGH (ref 70–99)
Potassium: 4.6 mmol/L (ref 3.5–5.1)
Sodium: 137 mmol/L (ref 135–145)

## 2020-03-28 LAB — CBC
HCT: 43.4 % (ref 39.0–52.0)
Hemoglobin: 13.6 g/dL (ref 13.0–17.0)
MCH: 28.5 pg (ref 26.0–34.0)
MCHC: 31.3 g/dL (ref 30.0–36.0)
MCV: 91 fL (ref 80.0–100.0)
Platelets: 240 10*3/uL (ref 150–400)
RBC: 4.77 MIL/uL (ref 4.22–5.81)
RDW: 12.1 % (ref 11.5–15.5)
WBC: 5 10*3/uL (ref 4.0–10.5)
nRBC: 0 % (ref 0.0–0.2)

## 2020-03-28 LAB — TROPONIN I (HIGH SENSITIVITY)
Troponin I (High Sensitivity): 5 ng/L (ref <18)
Troponin I (High Sensitivity): 4 ng/L (ref <18)

## 2020-03-28 MED ORDER — ALUM & MAG HYDROXIDE-SIMETH 200-200-20 MG/5ML PO SUSP
30.0000 mL | Freq: Once | ORAL | Status: AC
  Administered 2020-03-28: 30 mL via ORAL
  Filled 2020-03-28: qty 30

## 2020-03-28 MED ORDER — LIDOCAINE VISCOUS HCL 2 % MT SOLN
15.0000 mL | Freq: Once | OROMUCOSAL | Status: AC
  Administered 2020-03-28: 15 mL via ORAL
  Filled 2020-03-28: qty 15

## 2020-03-28 NOTE — ED Notes (Signed)
Patient verbalizes understanding of discharge instructions. Opportunity for questioning and answers were provided. Arm band removed by staff, patient discharged from ED. 

## 2020-03-28 NOTE — ED Triage Notes (Signed)
Pt states since yesterday he has been having intermittent chest pain with palpations that seem to come and go. Pt was seen had [facility] had xray and xray which he has a disc for but told to come to ED to rule out ACS.

## 2020-03-28 NOTE — Discharge Instructions (Addendum)
Please call the cardiology office above to schedule an appointment.  They will be able to get you a heart monitor patch to monitor your heart rhythm.  This will help evaluate your heart palpitations.  Please return to the emergency department if you have worsening chest pain, shortness of breath, or loss of consciousness.  For your [facility] follow-[facility], you can tell them that you had a normal chest x-ray.  Your EKG looked similar to EKGs you have had in the past with no signs of new heart attack.  Your troponin levels were 5 and 4, which are well below the upper limit of normal.  Your blood counts, electrolytes, and kidney function were normal.

## 2020-03-28 NOTE — ED Provider Notes (Signed)
[facility] EMERGENCY DEPARTMENT Provider Note   CSN: [id number] Arrival date & time: 03/28/20  1211     History Chief Complaint  Patient presents with  . Chest Pain    Adrian Meza is a 49 y.o. male with no significant past medical history who presents with 2 days of intermittent chest pain and heart palpitations.  Patient states that the pain is central to left-sided and is a pressure-like sensation that comes and goes every few minutes.  Does not think that it is associated with food intake, although he does note that it started up again after leaving [employer] last night with his son.  He also notes intermittent palpitations where he feels his heart is racing.  At first, he thought his chest pain was due to working out too hard on Friday night, but noted that it was not sore to the touch.  Remembers having chest pain a long time ago, but does not member what this was related to.  He went to urgent care this morning, and was sent to emergency department from there.  Denies shortness of breath, nausea, vomiting, radiation of pain, leg swelling.  He does not smoke.   Chest Pain Pain location:  L chest Pain quality: pressure   Pain radiates to:  Does not radiate Pain severity:  Moderate Duration:  2 days Timing:  Intermittent Progression:  Unchanged Chronicity:  New Relieved by:  None tried Worsened by:  Nothing Ineffective treatments:  None tried Associated symptoms: palpitations   Associated symptoms: no abdominal pain, no back pain, no cough, no fever, no nausea, no shortness of breath and no vomiting        No past medical history on file.  Patient Active Problem List   Diagnosis Date Noted  . CHEST PAIN, ATYPICAL 11/26/2008    Past Surgical History:  Procedure Laterality Date  . ABDOMINAL SURGERY    . HIP SURGERY     right side, surgery in Feb 2018       No family history on file.  Social History   Tobacco Use  . Smoking status:  Never Smoker  . Smokeless tobacco: Current User  Substance Use Topics  . Alcohol use: No  . Drug use: No    Home Medications Prior to Admission medications   Medication Sig Start Date End Date Taking? Authorizing Provider  cephALEXin (KEFLEX) 500 MG capsule Take 1 capsule (500 mg total) by mouth 2 (two) times daily. 03/22/13   Irish Elders, FNP  cyclobenzaprine (FLEXERIL) 10 MG tablet Take 1 tablet (10 mg total) by mouth 2 (two) times daily as needed for muscle spasms. 12/08/16   Antony Madura, PA-C  erythromycin ophthalmic ointment Place a 1/2 inch ribbon of ointment into the lower eyelid. Three times daily. 03/22/13   Irish Elders, FNP  HYDROcodone-acetaminophen (NORCO/VICODIN) 5-325 MG tablet Take 1-2 tablets by mouth every 6 (six) hours as needed. 04/19/16   Roxy Horseman, PA-C  ibuprofen (ADVIL,MOTRIN) 600 MG tablet Take 1 tablet (600 mg total) by mouth every 6 (six) hours as needed. 12/08/16   Antony Madura, PA-C  OVER THE COUNTER MEDICATION Place 2 drops into the left eye daily as needed (eye irritation). Purchased at [facility], recommended by the [facility].    [provider]    Allergies    Patient has no known allergies.  Review of Systems   Review of Systems  Constitutional: Negative for chills and fever.  HENT: Negative for ear pain and sore throat.  Eyes: Negative for pain and visual disturbance.  Respiratory: Negative for cough and shortness of breath.   Cardiovascular: Positive for chest pain and palpitations.  Gastrointestinal: Negative for abdominal pain, nausea and vomiting.  Genitourinary: Negative for dysuria and hematuria.  Musculoskeletal: Negative for arthralgias and back pain.  Skin: Negative for color change and rash.  Neurological: Negative for seizures and syncope.  All other systems reviewed and are negative.   Physical Exam Updated Vital Signs BP 130/86 (BP Location: Left Arm)   Pulse 67   Temp 98 F (36.7 C) (Oral)   Resp 16   SpO2 100%    Physical Exam Vitals and nursing note reviewed.  Constitutional:      Appearance: He is well-developed.  HENT:     Head: Normocephalic and atraumatic.  Eyes:     Conjunctiva/sclera: Conjunctivae normal.  Cardiovascular:     Rate and Rhythm: Normal rate and regular rhythm.     Heart sounds: No murmur heard.   Pulmonary:     Effort: Pulmonary effort is normal. No respiratory distress.     Breath sounds: Normal breath sounds.  Abdominal:     Palpations: Abdomen is soft.     Tenderness: There is no abdominal tenderness.  Musculoskeletal:     Cervical back: Neck supple.     Right lower leg: No edema.     Left lower leg: No edema.  Skin:    General: Skin is warm and dry.  Neurological:     General: No focal deficit present.     Mental Status: He is alert and oriented to person, place, and time.     ED Results / Procedures / Treatments   Labs (all labs ordered are listed, but only abnormal results are displayed) Labs Reviewed  BASIC METABOLIC PANEL - Abnormal; Notable for the following components:      Result Value   Glucose, Bld 103 (*)    All other components within normal limits  CBC  TROPONIN I (HIGH SENSITIVITY)  TROPONIN I (HIGH SENSITIVITY)    EKG EKG Interpretation  Date/Time:  Monday March 28 2020 12:31:37 EDT Ventricular Rate:  62 PR Interval:  188 QRS Duration: 104 QT Interval:  396 QTC Calculation: 401 R Axis:   39 Text Interpretation: Normal sinus rhythm T wave abnormality, consider inferior ischemia Abnormal ECG st elevation on prior ECG  resolved Confirmed by Linwood Dibbles [phone]) on 03/28/2020 4:11:02 PM   Radiology DG Chest 2 View  Result Date: 03/28/2020 CLINICAL DATA:  Chest pain, palpitations EXAM: CHEST - 2 VIEW COMPARISON:  10/12/2010. FINDINGS: The heart size and mediastinal contours are within normal limits. No focal airspace consolidation, pleural effusion, or pneumothorax. The visualized skeletal structures are unremarkable.  IMPRESSION: No active cardiopulmonary disease. Electronically Signed   By: Duanne Guess D.O.   On: 03/28/2020 13:16    Procedures Procedures (including critical care time)  Medications Ordered in ED Medications  alum & mag hydroxide-simeth (MAALOX/MYLANTA) 200-200-20 MG/5ML suspension 30 mL (30 mLs Oral Given 03/28/20 1614)    And  lidocaine (XYLOCAINE) 2 % viscous mouth solution 15 mL (15 mLs Oral Given 03/28/20 1614)    ED Course  I have reviewed the triage vital signs and the nursing notes.  Pertinent labs & imaging results that were available during my care of the patient were reviewed by me and considered in my medical decision making (see chart for details).    MDM Rules/Calculators/A&P  History and exam not consistent with aortic dissection, PE. PERC 0.  CBC, BMP unremarkable.  Initial troponin was 5, repeat 4.  Chest x-ray with normal cardiac silhouette, no pneumonia, or other abnormality.  EKG shows normal sinus rhythm with inverted T waves in leads III, aVF, and V6.  These are present on his EKG from 5 years ago, however that EKG also had inverted T waves in lead II.  HEAR score 3.  Patient given cardiology clinic information for follow-up and a Zio patch, but he prefers to follow-up at the [place] instead.  He is stable for discharge at this time.  This patient was seen with Dr. Lynelle Doctor.  Final Clinical Impression(s) / ED Diagnoses Final diagnoses:  Chest pain, unspecified type    Rx / DC Orders ED Discharge Orders    None       Allayne Butcher, MD 03/28/20 2215    Linwood Dibbles, MD 03/31/20 [phone]
